# Patient Record
Sex: Male | Born: 1945 | Race: Black or African American | Hispanic: No | Marital: Married
Health system: Southern US, Community
[De-identification: ages and names within clinical notes are randomized; demographics above are authoritative.]

---

## 2018-06-02 ENCOUNTER — Other Ambulatory Visit (HOSPITAL_COMMUNITY): Payer: Medicare Other

## 2018-06-02 ENCOUNTER — Inpatient Hospital Stay
Admission: RE | Admit: 2018-06-02 | Discharge: 2018-06-22 | Disposition: A | Discharge status: Skilled Nursing Facility | Payer: Medicare Other | Source: Ambulatory Visit | Attending: Internal Medicine | Admitting: Internal Medicine

## 2018-06-02 DIAGNOSIS — Z4659 Encounter for fitting and adjustment of other gastrointestinal appliance and device: Secondary | ICD-10-CM

## 2018-06-02 DIAGNOSIS — J9690 Respiratory failure, unspecified, unspecified whether with hypoxia or hypercapnia: Secondary | ICD-10-CM

## 2018-06-02 DIAGNOSIS — Z431 Encounter for attention to gastrostomy: Secondary | ICD-10-CM

## 2018-06-03 ENCOUNTER — Other Ambulatory Visit (HOSPITAL_COMMUNITY): Payer: Medicare Other

## 2018-06-03 LAB — COMPREHENSIVE METABOLIC PANEL
ALT: 50 U/L — ABNORMAL HIGH (ref 0–44)
AST: 74 U/L — AB (ref 15–41)
Albumin: 1.7 g/dL — ABNORMAL LOW (ref 3.5–5.0)
Alkaline Phosphatase: 113 U/L (ref 38–126)
Anion gap: 7 (ref 5–15)
BUN: 19 mg/dL (ref 8–23)
CHLORIDE: 105 mmol/L (ref 98–111)
CO2: 24 mmol/L (ref 22–32)
Calcium: 7.7 mg/dL — ABNORMAL LOW (ref 8.9–10.3)
Creatinine, Ser: 1.06 mg/dL (ref 0.61–1.24)
Glucose, Bld: 101 mg/dL — ABNORMAL HIGH (ref 70–99)
POTASSIUM: 3.7 mmol/L (ref 3.5–5.1)
Sodium: 136 mmol/L (ref 135–145)
Total Bilirubin: 1.1 mg/dL (ref 0.3–1.2)
Total Protein: 5.9 g/dL — ABNORMAL LOW (ref 6.5–8.1)

## 2018-06-03 LAB — CBC WITH DIFFERENTIAL/PLATELET
ABS IMMATURE GRANULOCYTES: 0 10*3/uL (ref 0.0–0.1)
Basophils Absolute: 0 10*3/uL (ref 0.0–0.1)
Basophils Relative: 1 %
Eosinophils Absolute: 0.3 10*3/uL (ref 0.0–0.7)
Eosinophils Relative: 5 %
HEMATOCRIT: 24.5 % — AB (ref 39.0–52.0)
Hemoglobin: 8 g/dL — ABNORMAL LOW (ref 13.0–17.0)
IMMATURE GRANULOCYTES: 1 %
LYMPHS ABS: 1.5 10*3/uL (ref 0.7–4.0)
LYMPHS PCT: 30 %
MCH: 32.3 pg (ref 26.0–34.0)
MCHC: 32.7 g/dL (ref 30.0–36.0)
MCV: 98.8 fL (ref 78.0–100.0)
Monocytes Absolute: 0.3 10*3/uL (ref 0.1–1.0)
Monocytes Relative: 7 %
Neutro Abs: 3 10*3/uL (ref 1.7–7.7)
Neutrophils Relative %: 56 %
Platelets: 86 10*3/uL — ABNORMAL LOW (ref 150–400)
RBC: 2.48 MIL/uL — AB (ref 4.22–5.81)
RDW: 18 % — AB (ref 11.5–15.5)
WBC: 5.2 10*3/uL (ref 4.0–10.5)

## 2018-06-03 LAB — PHOSPHORUS: PHOSPHORUS: 3.8 mg/dL (ref 2.5–4.6)

## 2018-06-03 LAB — AMYLASE: AMYLASE: 148 U/L — AB (ref 28–100)

## 2018-06-03 LAB — LIPASE, BLOOD: Lipase: 95 U/L — ABNORMAL HIGH (ref 11–51)

## 2018-06-03 LAB — MAGNESIUM: Magnesium: 1.6 mg/dL — ABNORMAL LOW (ref 1.7–2.4)

## 2018-06-05 MED ORDER — LACTULOSE 10 GM/15ML PO SOLN
20.00 | ORAL | Status: DC
Start: 2018-06-02 — End: 2018-06-05

## 2018-06-05 MED ORDER — INSULIN LISPRO 100 UNIT/ML ~~LOC~~ SOLN
1.00 | SUBCUTANEOUS | Status: DC
Start: ? — End: 2018-06-05

## 2018-06-05 MED ORDER — INSULIN GLARGINE 100 UNIT/ML ~~LOC~~ SOLN
1.00 | SUBCUTANEOUS | Status: DC
Start: 2018-06-02 — End: 2018-06-05

## 2018-06-05 MED ORDER — NOREPINEPHRINE-SODIUM CHLORIDE 8-0.9 MG/250ML-% IV SOLN
0.50 | INTRAVENOUS | Status: DC
Start: ? — End: 2018-06-05

## 2018-06-05 MED ORDER — INSULIN GLARGINE 100 UNIT/ML ~~LOC~~ SOLN
1.00 | SUBCUTANEOUS | Status: DC
Start: ? — End: 2018-06-05

## 2018-06-05 MED ORDER — FAMOTIDINE 20 MG PO TABS
20.00 | ORAL_TABLET | ORAL | Status: DC
Start: 2018-06-02 — End: 2018-06-05

## 2018-06-05 MED ORDER — CHLORHEXIDINE GLUCONATE 0.12 % MT SOLN
15.00 | OROMUCOSAL | Status: DC
Start: 2018-06-02 — End: 2018-06-05

## 2018-06-05 MED ORDER — FLUDROCORTISONE ACETATE 0.1 MG PO TABS
100.00 | ORAL_TABLET | ORAL | Status: DC
Start: 2018-06-03 — End: 2018-06-05

## 2018-06-05 MED ORDER — GENERIC EXTERNAL MEDICATION
1.00 | Status: DC
Start: ? — End: 2018-06-05

## 2018-06-05 MED ORDER — MORPHINE SULFATE (PF) 4 MG/ML IV SOLN
1.00 | INTRAVENOUS | Status: DC
Start: ? — End: 2018-06-05

## 2018-06-05 MED ORDER — TROPICAL LIQUID NUTRITION PO LIQD
5.00 | ORAL | Status: DC
Start: 2018-06-03 — End: 2018-06-05

## 2018-06-05 MED ORDER — ONDANSETRON HCL 4 MG/2ML IJ SOLN
4.00 | INTRAMUSCULAR | Status: DC
Start: ? — End: 2018-06-05

## 2018-06-05 MED ORDER — SODIUM CHLORIDE 0.9 % IV SOLN
10.00 | INTRAVENOUS | Status: DC
Start: ? — End: 2018-06-05

## 2018-06-05 MED ORDER — GENERIC EXTERNAL MEDICATION
10.00 | Status: DC
Start: ? — End: 2018-06-05

## 2018-06-05 MED ORDER — FUROSEMIDE 10 MG/ML IJ SOLN
20.00 | INTRAMUSCULAR | Status: DC
Start: 2018-06-03 — End: 2018-06-05

## 2018-06-05 MED ORDER — INSULIN LISPRO 100 UNIT/ML ~~LOC~~ SOLN
1.00 | SUBCUTANEOUS | Status: DC
Start: 2018-06-02 — End: 2018-06-05

## 2018-06-06 LAB — BASIC METABOLIC PANEL
ANION GAP: 9 (ref 5–15)
BUN: 17 mg/dL (ref 8–23)
CALCIUM: 8.3 mg/dL — AB (ref 8.9–10.3)
CO2: 30 mmol/L (ref 22–32)
Chloride: 97 mmol/L — ABNORMAL LOW (ref 98–111)
Creatinine, Ser: 0.89 mg/dL (ref 0.61–1.24)
GFR calc non Af Amer: >60 mL/min (ref >60)
GLUCOSE: 109 mg/dL — AB (ref 70–99)
Potassium: 3.3 mmol/L — ABNORMAL LOW (ref 3.5–5.1)
Sodium: 136 mmol/L (ref 135–145)

## 2018-06-06 LAB — CBC
HCT: 28 % — ABNORMAL LOW (ref 39.0–52.0)
HEMOGLOBIN: 9.1 g/dL — AB (ref 13.0–17.0)
MCH: 32.3 pg (ref 26.0–34.0)
MCHC: 32.5 g/dL (ref 30.0–36.0)
MCV: 99.3 fL (ref 78.0–100.0)
Platelets: 145 10*3/uL — ABNORMAL LOW (ref 150–400)
RBC: 2.82 MIL/uL — ABNORMAL LOW (ref 4.22–5.81)
RDW: 17.2 % — AB (ref 11.5–15.5)
WBC: 6.6 10*3/uL (ref 4.0–10.5)

## 2018-06-06 LAB — MAGNESIUM: MAGNESIUM: 1.8 mg/dL (ref 1.7–2.4)

## 2018-06-08 LAB — POTASSIUM: Potassium: 3.7 mmol/L (ref 3.5–5.1)

## 2018-06-13 ENCOUNTER — Other Ambulatory Visit (HOSPITAL_COMMUNITY): Payer: Medicare Other

## 2018-06-13 LAB — BASIC METABOLIC PANEL
ANION GAP: 9 (ref 5–15)
BUN: 26 mg/dL — ABNORMAL HIGH (ref 8–23)
CHLORIDE: 98 mmol/L (ref 98–111)
CO2: 27 mmol/L (ref 22–32)
Calcium: 8 mg/dL — ABNORMAL LOW (ref 8.9–10.3)
Creatinine, Ser: 0.92 mg/dL (ref 0.61–1.24)
GFR calc non Af Amer: >60 mL/min (ref >60)
Glucose, Bld: 92 mg/dL (ref 70–99)
POTASSIUM: 4.2 mmol/L (ref 3.5–5.1)
Sodium: 134 mmol/L — ABNORMAL LOW (ref 135–145)

## 2018-06-13 LAB — URINALYSIS, ROUTINE W REFLEX MICROSCOPIC
Bilirubin Urine: NEGATIVE
GLUCOSE, UA: NEGATIVE mg/dL
Hgb urine dipstick: NEGATIVE
Ketones, ur: NEGATIVE mg/dL
Nitrite: NEGATIVE
PH: 6.5 (ref 5.0–8.0)
PROTEIN: NEGATIVE mg/dL

## 2018-06-13 LAB — CBC
HEMATOCRIT: 29.6 % — AB (ref 39.0–52.0)
HEMOGLOBIN: 9.7 g/dL — AB (ref 13.0–17.0)
MCH: 32.8 pg (ref 26.0–34.0)
MCHC: 32.8 g/dL (ref 30.0–36.0)
MCV: 100 fL (ref 78.0–100.0)
PLATELETS: 360 10*3/uL (ref 150–400)
RBC: 2.96 MIL/uL — AB (ref 4.22–5.81)
RDW: 16 % — ABNORMAL HIGH (ref 11.5–15.5)
WBC: 10.6 10*3/uL — AB (ref 4.0–10.5)

## 2018-06-13 LAB — URINALYSIS, MICROSCOPIC (REFLEX)

## 2018-06-14 ENCOUNTER — Other Ambulatory Visit (HOSPITAL_COMMUNITY): Payer: Medicare Other

## 2018-06-14 LAB — BLOOD CULTURE ID PANEL (REFLEXED)
Acinetobacter baumannii: NOT DETECTED
CANDIDA PARAPSILOSIS: NOT DETECTED
CANDIDA TROPICALIS: NOT DETECTED
Candida albicans: NOT DETECTED
Candida glabrata: NOT DETECTED
Candida krusei: NOT DETECTED
Enterobacter cloacae complex: NOT DETECTED
Enterobacteriaceae species: NOT DETECTED
Enterococcus species: NOT DETECTED
Escherichia coli: NOT DETECTED
HAEMOPHILUS INFLUENZAE: NOT DETECTED
KLEBSIELLA OXYTOCA: NOT DETECTED
KLEBSIELLA PNEUMONIAE: NOT DETECTED
Listeria monocytogenes: NOT DETECTED
METHICILLIN RESISTANCE: DETECTED — AB
NEISSERIA MENINGITIDIS: NOT DETECTED
Proteus species: NOT DETECTED
Pseudomonas aeruginosa: NOT DETECTED
STREPTOCOCCUS SPECIES: NOT DETECTED
Serratia marcescens: NOT DETECTED
Staphylococcus aureus (BCID): NOT DETECTED
Staphylococcus species: DETECTED — AB
Streptococcus agalactiae: NOT DETECTED
Streptococcus pneumoniae: NOT DETECTED
Streptococcus pyogenes: NOT DETECTED

## 2018-06-14 NOTE — Progress Notes (Signed)
IR consulted for gastrostomy tube placement.  CT Abdomen obtained 06/13/18 and shows that anatomy is not amenable to placement in IR.  Called to unit, RN made aware.  Order cancelled as no procedure planned in IR at this time.   Loyce DysKacie Kayleena Eke, MS RD PA-C 8:15 AM

## 2018-06-15 LAB — URINE CULTURE

## 2018-06-16 LAB — CULTURE, BLOOD (ROUTINE X 2)

## 2018-06-18 LAB — CULTURE, BLOOD (ROUTINE X 2)
Culture: NO GROWTH
Special Requests: ADEQUATE

## 2018-06-19 LAB — BASIC METABOLIC PANEL
ANION GAP: 5 (ref 5–15)
BUN: 17 mg/dL (ref 8–23)
CHLORIDE: 103 mmol/L (ref 98–111)
CO2: 29 mmol/L (ref 22–32)
Calcium: 8 mg/dL — ABNORMAL LOW (ref 8.9–10.3)
Creatinine, Ser: 1.01 mg/dL (ref 0.61–1.24)
GFR calc Af Amer: >60 mL/min (ref >60)
GFR calc non Af Amer: >60 mL/min (ref >60)
Glucose, Bld: 77 mg/dL (ref 70–99)
POTASSIUM: 3.5 mmol/L (ref 3.5–5.1)
Sodium: 137 mmol/L (ref 135–145)

## 2018-06-19 LAB — CBC
HCT: 27.1 % — ABNORMAL LOW (ref 39.0–52.0)
Hemoglobin: 8.8 g/dL — ABNORMAL LOW (ref 13.0–17.0)
MCH: 32.8 pg (ref 26.0–34.0)
MCHC: 32.5 g/dL (ref 30.0–36.0)
MCV: 101.1 fL — AB (ref 78.0–100.0)
Platelets: 388 10*3/uL (ref 150–400)
RBC: 2.68 MIL/uL — ABNORMAL LOW (ref 4.22–5.81)
RDW: 15.7 % — AB (ref 11.5–15.5)
WBC: 7.7 10*3/uL (ref 4.0–10.5)

## 2019-07-31 IMAGING — CT CT ABDOMEN W/O CM
2 of 4 series · 16 of 46 positions shown, 18 images · non-contrast
Comparison: Abdominal film on 06/02/2018

CLINICAL DATA: Evaluation of abdominal anatomy for possible
percutaneous gastrostomy tube placement.

EXAM:
CT ABDOMEN WITHOUT CONTRAST
TECHNIQUE: Multidetector CT imaging of the abdomen was performed following the
standard protocol without IV contrast.

[Series 3: a/p w/o 5mm · axial · non-contrast · 0.72mm/px · z∈[+1004,+1289]mm · 13 of 64 slices shown, 15 images]
[im 4/64  soft-tissue]
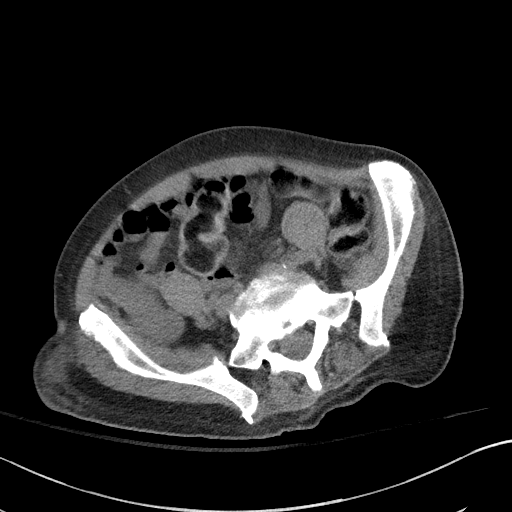
[im 4/64  bone]
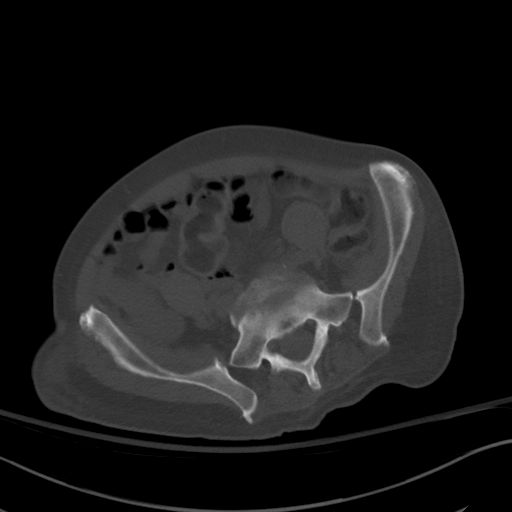
[im 10/64  soft-tissue]
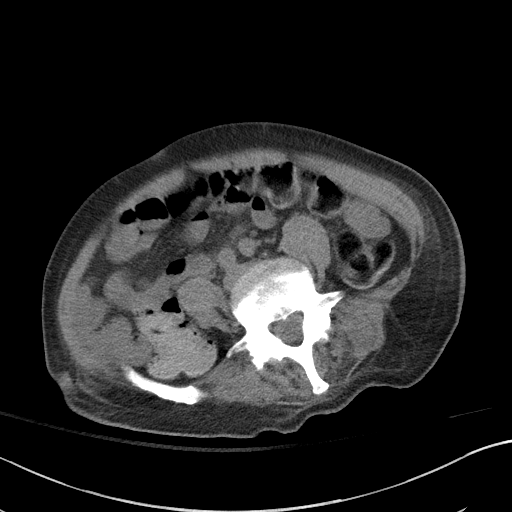
[im 13/64  soft-tissue]
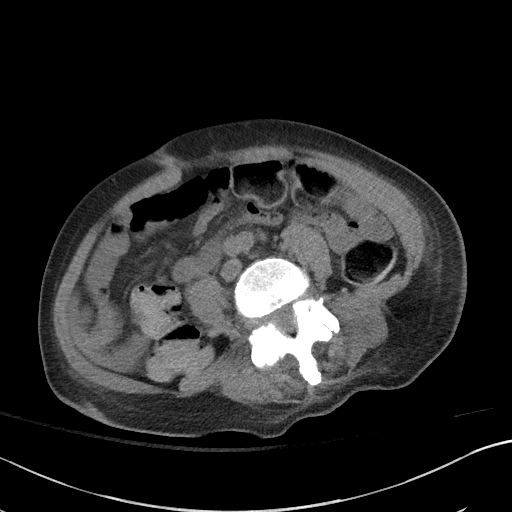
[im 19/64  soft-tissue]
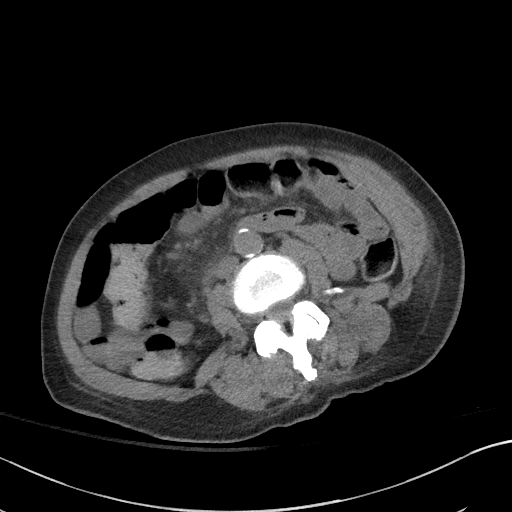
[im 22/64  soft-tissue]
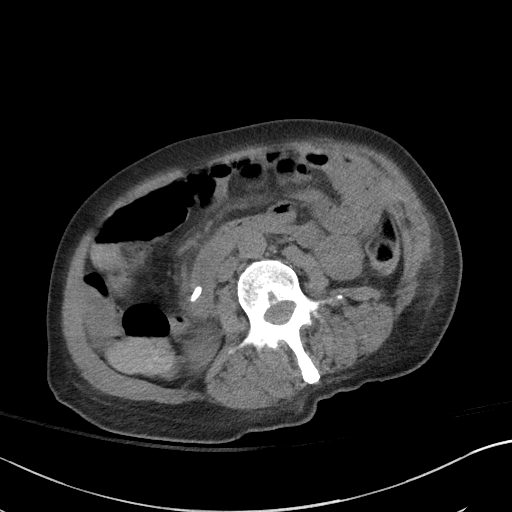
[im 28/64  soft-tissue]
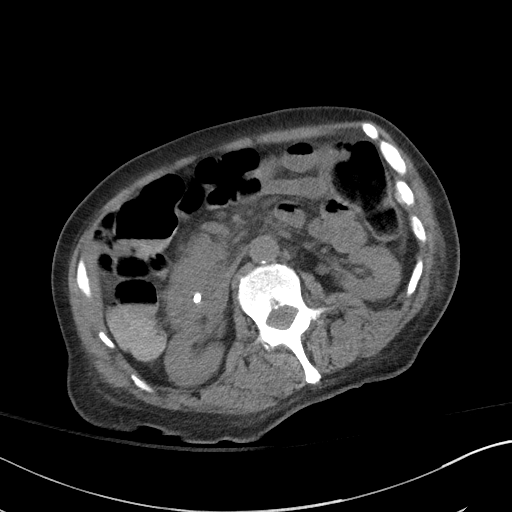
[im 34/64  soft-tissue]
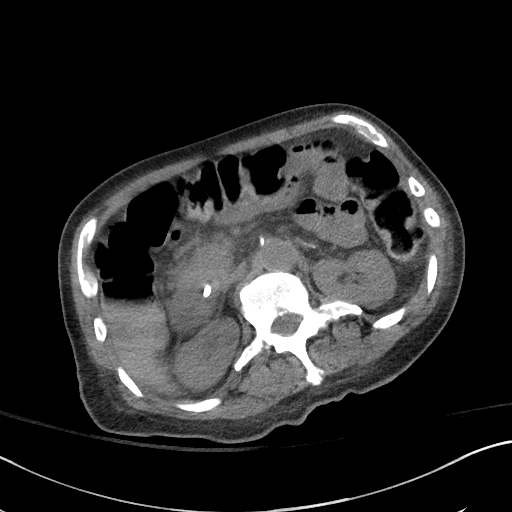
[im 37/64  soft-tissue]
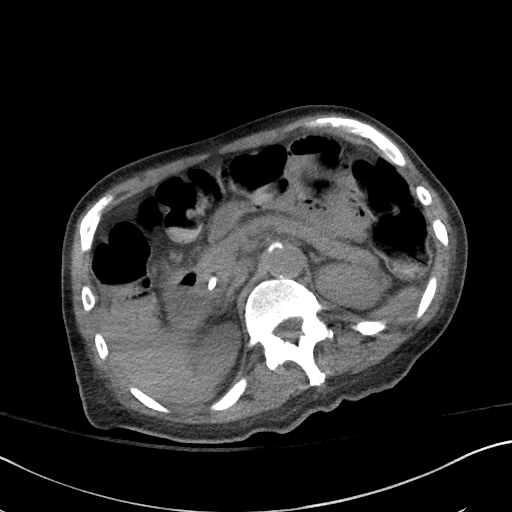
[im 43/64  soft-tissue]
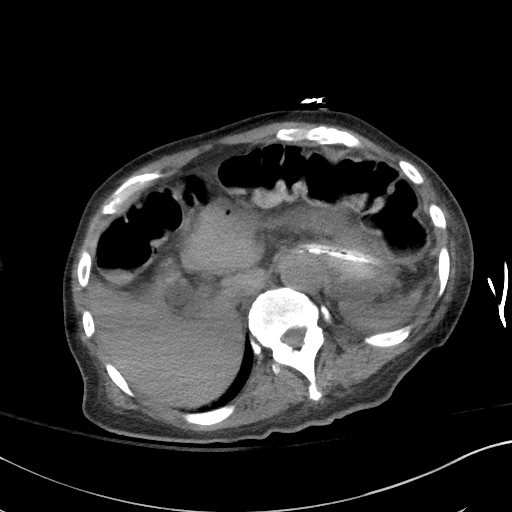
[im 43/64  bone]
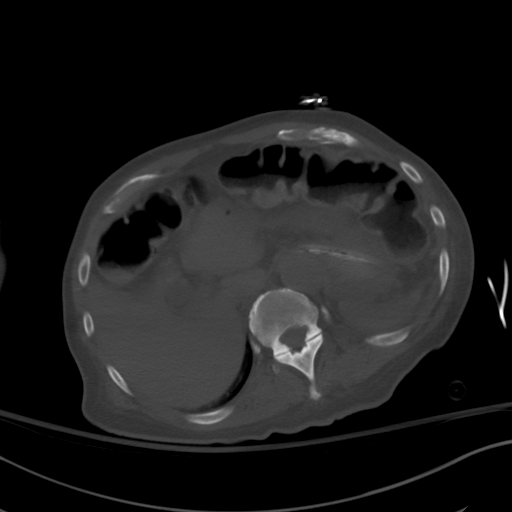
[im 46/64  soft-tissue]
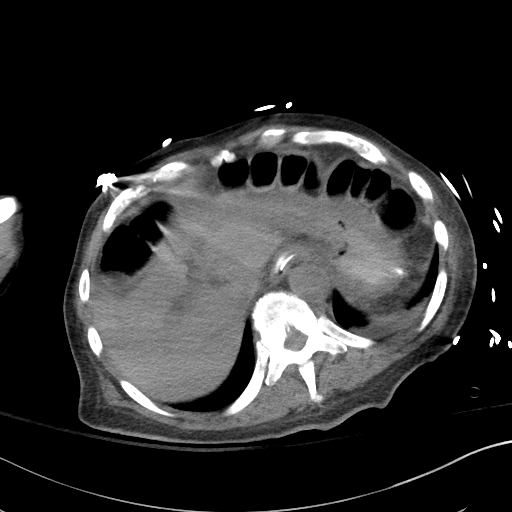
[im 52/64  soft-tissue]
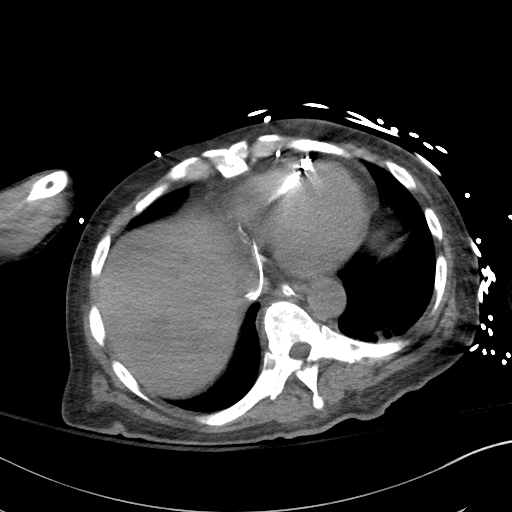
[im 55/64  soft-tissue]
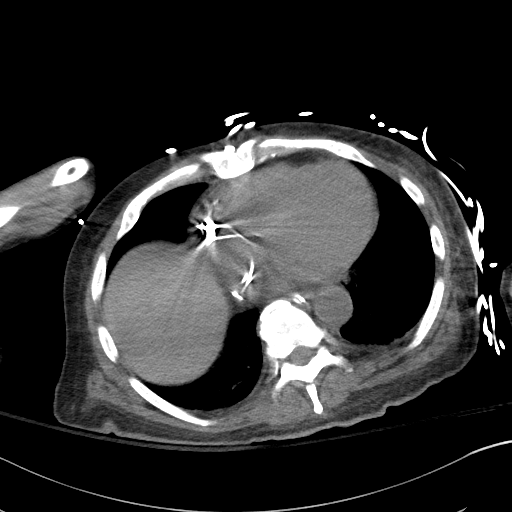
[im 61/64  soft-tissue]
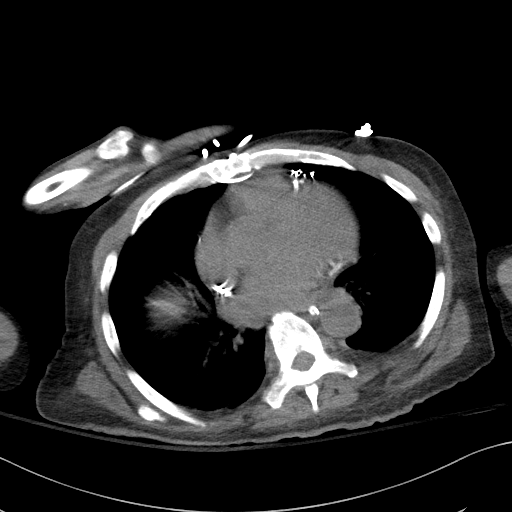

[Series 6: a/p w/o cor · coronal · non-contrast · 0.57mm/px · 3 of 151 slices shown]
[im 51/151  soft-tissue]
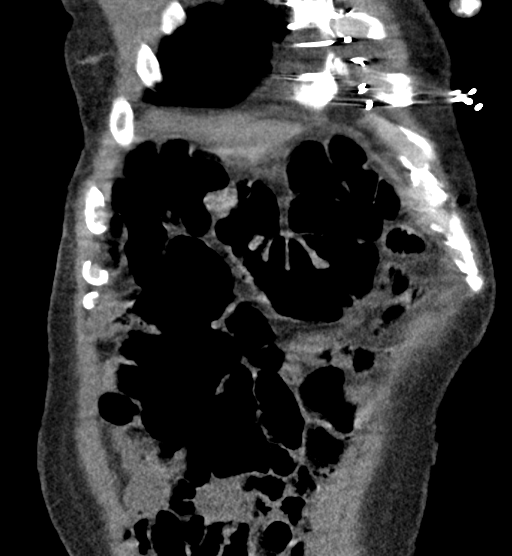
[im 67/151  soft-tissue]
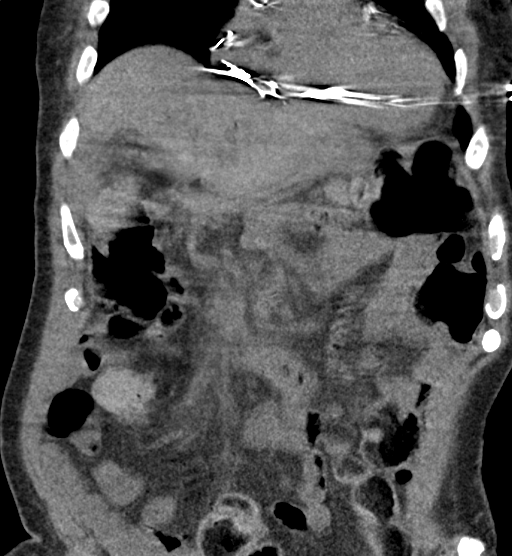
[im 84/151  soft-tissue]
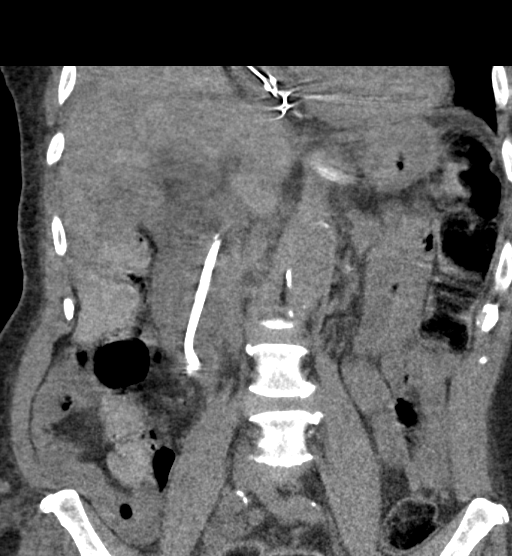

[16 of 46 positions shown; findings below may reference images not displayed]

FINDINGS: Lower chest: Atelectasis/infiltrate in the posterior right lower
lobe.

Hepatobiliary: The liver appears unremarkable. The gallbladder is
either contracted or has been previously removed. No gross biliary
ductal dilatation.

Pancreas: Unremarkable. No pancreatic ductal dilatation or
surrounding inflammatory changes.

Spleen: Normal in size without focal abnormality.

Adrenals/Urinary Tract: Adrenal glands are unremarkable. Kidneys are
normal, without renal calculi, focal lesion, or hydronephrosis.

Stomach/Bowel: No hiatal hernia is present. A gastric decompression
tube extends into the proximal stomach. The stomach is decompressed
and is high in position, entirely posterior transverse colon and
splenic flexure. There is gaseous distention of the colon consistent
with ileus. An endoscopic biliary stent is present extending from
the common bile duct into the duodenal lumen. No evidence of dilated
small bowel loops or free intraperitoneal air.

Vascular/Lymphatic: No significant vascular findings are present. No
enlarged abdominal lymph nodes.

Other: No abnormal fluid collections or evidence of abdominal wall
hernias.

Musculoskeletal: No acute or significant osseous findings.
IMPRESSION: 1. Current anatomic relationship of the stomach and colon is
prohibitive to safe placement of a percutaneous gastrostomy tube.
The stomach currently lies entirely posterior to the transverse
colon and splenic flexure. The colon is also distended with air,
consistent with ileus. The only possibility for potential future
percutaneous gastrostomy tube placement would be resolution of
colonic ileus and repeat CT imaging demonstrating a safe anterior
percutaneous window to the stomach.
2. Common bile duct stent present.

## 2023-11-19 DEATH — deceased
